# Patient Record
Sex: Female | Born: 1970 | Race: Black or African American | Hispanic: No | Marital: Married | Smoking: Current every day smoker
Health system: Southern US, Community
[De-identification: ages and names within clinical notes are randomized; demographics above are authoritative.]

## PROBLEM LIST (undated history)

## (undated) DIAGNOSIS — I639 Cerebral infarction, unspecified: Secondary | ICD-10-CM

## (undated) DIAGNOSIS — I10 Essential (primary) hypertension: Secondary | ICD-10-CM

## (undated) DIAGNOSIS — E119 Type 2 diabetes mellitus without complications: Secondary | ICD-10-CM

## (undated) DIAGNOSIS — O039 Complete or unspecified spontaneous abortion without complication: Secondary | ICD-10-CM

## (undated) HISTORY — PX: CYST EXCISION: SHX5701

## (undated) HISTORY — PX: DILATATION & CURETTAGE/HYSTEROSCOPY WITH TRUECLEAR: SHX6353

## (undated) HISTORY — PX: TUBAL LIGATION: SHX77

---

## 2013-08-10 ENCOUNTER — Emergency Department (HOSPITAL_BASED_OUTPATIENT_CLINIC_OR_DEPARTMENT_OTHER)
Admission: EM | Admit: 2013-08-10 | Discharge: 2013-08-10 | Disposition: A | Discharge status: Home / Self Care | Payer: Medicaid Other | Attending: Emergency Medicine | Admitting: Emergency Medicine

## 2013-08-10 ENCOUNTER — Encounter (HOSPITAL_BASED_OUTPATIENT_CLINIC_OR_DEPARTMENT_OTHER): Payer: Self-pay | Admitting: Emergency Medicine

## 2013-08-10 ENCOUNTER — Emergency Department (HOSPITAL_BASED_OUTPATIENT_CLINIC_OR_DEPARTMENT_OTHER): Payer: Medicaid Other

## 2013-08-10 DIAGNOSIS — R0789 Other chest pain: Secondary | ICD-10-CM

## 2013-08-10 DIAGNOSIS — R109 Unspecified abdominal pain: Secondary | ICD-10-CM | POA: Insufficient documentation

## 2013-08-10 DIAGNOSIS — Z791 Long term (current) use of non-steroidal anti-inflammatories (NSAID): Secondary | ICD-10-CM | POA: Insufficient documentation

## 2013-08-10 DIAGNOSIS — R071 Chest pain on breathing: Secondary | ICD-10-CM | POA: Insufficient documentation

## 2013-08-10 DIAGNOSIS — Z880 Allergy status to penicillin: Secondary | ICD-10-CM | POA: Insufficient documentation

## 2013-08-10 DIAGNOSIS — R9431 Abnormal electrocardiogram [ECG] [EKG]: Secondary | ICD-10-CM | POA: Insufficient documentation

## 2013-08-10 DIAGNOSIS — F17200 Nicotine dependence, unspecified, uncomplicated: Secondary | ICD-10-CM | POA: Insufficient documentation

## 2013-08-10 DIAGNOSIS — M25519 Pain in unspecified shoulder: Secondary | ICD-10-CM | POA: Insufficient documentation

## 2013-08-10 HISTORY — DX: Complete or unspecified spontaneous abortion without complication: O03.9

## 2013-08-10 LAB — COMPREHENSIVE METABOLIC PANEL WITH GFR
ALT: 10 U/L (ref 0–35)
AST: 11 U/L (ref 0–37)
Albumin: 3.8 g/dL (ref 3.5–5.2)
Alkaline Phosphatase: 69 U/L (ref 39–117)
BUN: 14 mg/dL (ref 6–23)
CO2: 24 meq/L (ref 19–32)
Calcium: 9.4 mg/dL (ref 8.4–10.5)
Chloride: 104 meq/L (ref 96–112)
Creatinine, Ser: 0.8 mg/dL (ref 0.50–1.10)
GFR calc Af Amer: >90 mL/min
GFR calc non Af Amer: 90 mL/min — ABNORMAL LOW
Glucose, Bld: 134 mg/dL — ABNORMAL HIGH (ref 70–99)
Potassium: 3.6 meq/L (ref 3.5–5.1)
Sodium: 140 meq/L (ref 135–145)
Total Bilirubin: 0.2 mg/dL — ABNORMAL LOW (ref 0.3–1.2)
Total Protein: 7.9 g/dL (ref 6.0–8.3)

## 2013-08-10 LAB — CBC WITH DIFFERENTIAL/PLATELET
Basophils Absolute: 0 10*3/uL (ref 0.0–0.1)
Eosinophils Absolute: 0.3 10*3/uL (ref 0.0–0.7)
HCT: 41.3 % (ref 36.0–46.0)
Hemoglobin: 13.6 g/dL (ref 12.0–15.0)
Lymphs Abs: 2.8 10*3/uL (ref 0.7–4.0)
MCH: 27.8 pg (ref 26.0–34.0)
Monocytes Relative: 6 % (ref 3–12)
Neutro Abs: 7.9 10*3/uL — ABNORMAL HIGH (ref 1.7–7.7)
Neutrophils Relative %: 68 % (ref 43–77)
Platelets: 316 10*3/uL (ref 150–400)
RBC: 4.89 MIL/uL (ref 3.87–5.11)
WBC: 11.6 10*3/uL — ABNORMAL HIGH (ref 4.0–10.5)

## 2013-08-10 LAB — D-DIMER, QUANTITATIVE: D-Dimer, Quant: <0.27 ug{FEU}/mL (ref 0.00–0.48)

## 2013-08-10 LAB — TROPONIN I
Troponin I: <0.3 ng/mL
Troponin I: <0.3 ng/mL

## 2013-08-10 LAB — LIPASE, BLOOD: Lipase: 30 U/L (ref 11–59)

## 2013-08-10 MED ORDER — HYDROCODONE-ACETAMINOPHEN 5-325 MG PO TABS: 2.0000 | ORAL_TABLET | ORAL | Status: DC | PRN

## 2013-08-10 MED ORDER — IOHEXOL 350 MG/ML SOLN
100.0000 mL | Freq: Once | INTRAVENOUS | Status: AC | PRN
  Administered 2013-08-10: 100 mL via INTRAVENOUS

## 2013-08-10 MED ORDER — HYDROCODONE-ACETAMINOPHEN 5-325 MG PO TABS
2.0000 | ORAL_TABLET | Freq: Once | ORAL | Status: AC
  Administered 2013-08-10: 2 via ORAL
  Filled 2013-08-10: qty 2

## 2013-08-10 MED ORDER — IBUPROFEN 800 MG PO TABS: 800.0000 mg | ORAL_TABLET | Freq: Three times a day (TID) | ORAL | Status: DC

## 2013-08-10 NOTE — ED Provider Notes (Signed)
CSN: [id number]     Arrival date & time 08/10/13  0908 History   First MD Initiated Contact with Patient 08/10/13 [phone]     Chief Complaint  Patient presents with  . Chest Pain    Began yesterday before lifting grandson from the floor. Worse with movement and when "laying on it". Was SOB yest but this was resolved after 2 min  . Arm Pain    left arm   (Consider location/radiation/quality/duration/timing/severity/associated sxs/prior Treatment) HPI Comments: Patient awoke yesterday morning with left sided chest pain and shoulder pain. This got worse when she attempted to pick up her 63 month old grandson. She's had constant pain since 9 AM yesterday. Is worse with movement worse with palpation. She endorses mild shortness of breath. She denies any nausea, vomiting or diaphoresis. She denies any cardiac history. She denies any history of hypertension, hypercholesterolemia. She is a smoker. She has not had pain like this in the past. There is no weakness, numbness or tingling in the arm. No headache. Denies any falls or injuries. She taking ibuprofen without relief. She had a tubal ligation last week in [facility].  The history is provided by the patient.    Past Medical History  Diagnosis Date  . Miscarriage     X 3  . Other multiple births, all stillborn    Past Surgical History  Procedure Laterality Date  . Tubal ligation    . Dilatation & curettage/hysteroscopy with trueclear      X 5  . Cyst excision    . Cesarean section  05/07/2013   History reviewed. No pertinent family history. History  Substance Use Topics  . Smoking status: Current Every Day Smoker -- 0.50 packs/day    Types: Cigarettes  . Smokeless tobacco: Not on file  . Alcohol Use: No   OB History   Grav Para Term Preterm Abortions TAB SAB Ect Mult Living                 Review of Systems  Constitutional: Negative for activity change and appetite change.  Respiratory: Positive for chest tightness. Negative  for cough.   Cardiovascular: Positive for chest pain.  Gastrointestinal: Positive for abdominal pain. Negative for nausea and vomiting.  Genitourinary: Negative for dysuria and hematuria.  Musculoskeletal: Positive for arthralgias and myalgias.  Skin: Negative for rash.  Neurological: Negative for dizziness, weakness and headaches.  A complete 10 system review of systems was obtained and all systems are negative except as noted in the HPI and PMH.    Allergies  Flagyl and Penicillins  Home Medications   Current Outpatient Rx  Name  Route  Sig  Dispense  Refill  . HYDROcodone-acetaminophen (NORCO/VICODIN) 5-325 MG per tablet   Oral   Take 2 tablets by mouth every 4 (four) hours as needed for pain.   10 tablet   0   . ibuprofen (ADVIL,MOTRIN) 800 MG tablet   Oral   Take 1 tablet (800 mg total) by mouth 3 (three) times daily.   21 tablet   0    BP 145/84  Pulse 77  Temp(Src) 98.3 F (36.8 C) (Oral)  Resp 20  Ht 5\' 2"  (1.575 m)  Wt 225 lb (102.059 kg)  BMI 41.14 kg/m2  SpO2 99%  LMP 07/26/2013 Physical Exam  Constitutional: She is oriented to person, place, and time. She appears well-developed and well-nourished. No distress.  HENT:  Head: Normocephalic and atraumatic.  Mouth/Throat: Oropharynx is clear and moist. No oropharyngeal  exudate.  Eyes: Conjunctivae and EOM are normal. Pupils are equal, round, and reactive to light.  Neck: Normal range of motion. Neck supple.  Cardiovascular: Normal rate, regular rhythm and normal heart sounds.   No murmur heard. Pulmonary/Chest: Effort normal and breath sounds normal. No respiratory distress. She exhibits tenderness.  Abdominal: Soft.  Musculoskeletal: Normal range of motion. She exhibits tenderness. She exhibits no edema.  L chest wall and shoulder tenderness with ROM of shoulder.  +2 radial pulse, cardinal hand movements intact  Neurological: She is alert and oriented to person, place, and time. No cranial nerve deficit.  She exhibits normal muscle tone. Coordination normal.  CN 2-12 intact, no ataxia on finger to nose, no nystagmus, 5/5 strength throughout, no pronator drift, Romberg negative, normal gait.   Skin: Skin is warm.    ED Course  Procedures (including critical care time) Labs Review Labs Reviewed  CBC WITH DIFFERENTIAL - Abnormal; Notable for the following:    WBC 11.6 (*)    Neutro Abs 7.9 (*)    All other components within normal limits  COMPREHENSIVE METABOLIC PANEL - Abnormal; Notable for the following:    Glucose, Bld 134 (*)    Total Bilirubin 0.2 (*)    GFR calc non Af Amer 90 (*)    All other components within normal limits  LIPASE, BLOOD  TROPONIN I  D-DIMER, QUANTITATIVE  TROPONIN I   Imaging Review Dg Chest 2 View  08/10/2013   CLINICAL DATA:  Chest pain  EXAM: CHEST  2 VIEW  COMPARISON:  None.  FINDINGS: The heart and pulmonary vascularity are within normal limits. The lungs are well aerated without infiltrate. No acute bony abnormality is seen.  IMPRESSION: No active cardiopulmonary disease.   Electronically Signed   By: Alcide Clever M.D.   On: 08/10/2013 12:46   Ct Angio Chest Pe W/cm &/or Wo Cm  08/10/2013   CLINICAL DATA:  Chest pain.  EXAM: CT ANGIOGRAPHY CHEST WITH CONTRAST  TECHNIQUE: Multidetector CT imaging of the chest was performed using the standard protocol during bolus administration of intravenous contrast. Multiplanar CT image reconstructions including MIPs were obtained to evaluate the vascular anatomy.  CONTRAST:  OMNIPAQUE IOHEXOL 350 MG/ML SOLN  COMPARISON:  None.  FINDINGS: The chest wall is unremarkable. No breast masses, supraclavicular or axillary adenopathy. The thyroid gland appears normal. Bony thorax is intact.  The heart is normal in size. No pericardial effusion. No mediastinal or hilar mass or adenopathy. There are somewhat prominent hilar and subcarinal lymph nodes. No discrete mass. The esophagus is grossly normal. The aorta is normal in  caliber. No dissection.  The pulmonary arterial tree is well opacified. No filling defects to suggest pulmonary embolism.  Examination of the lung parenchyma demonstrates no acute pulmonary findings. Minimal streaky dependent subsegmental atelectasis. No infiltrates, edema or effusions. No worrisome pulmonary lesions. No findings for interstitial lung disease or bronchiectasis. Patchy areas of early emphysematous change are noted.  The upper abdomen is unremarkable.  Review of the MIP images confirms the above findings.  IMPRESSION: No CT findings for pulmonary embolism.  Normal thoracic aorta.  Slightly prominent hilar and subcarinal lymph nodes. No interstitial lung disease to suggest sarcoidosis. A followup chest CT suggested in 4 months to reassess.  No acute pulmonary findings.  Suspect early emphysematous changes.   Electronically Signed   By: Loralie Champagne M.D.   On: 08/10/2013 12:07   Dg Shoulder Left  08/10/2013   CLINICAL DATA:  Left  shoulder pain  EXAM: LEFT SHOULDER - 2+ VIEW  COMPARISON:  None.  FINDINGS: Mild degenerative changes of the acromioclavicular joint are seen. No acute bony abnormality is noted. No soft tissue changes are seen.  IMPRESSION: Degenerative change without acute abnormality.   Electronically Signed   By: Alcide Clever M.D.   On: 08/10/2013 12:45    EKG Interpretation     Ventricular Rate:  85 PR Interval:  146 QRS Duration: 84 QT Interval:  368 QTC Calculation: 437 R Axis:   -35 Text Interpretation:  Normal sinus rhythm Left axis deviation Left ventricular hypertrophy Nonspecific T wave abnormality Abnormal ECG            MDM   1. EKG abnormality   2. Chest wall pain    24 hour history of left-sided chest pain that radiates to left shoulder is worse with palpation movement.  EKG today shows T wave inversions in inferior lead. EKG obtained from [facility] dated 02/22/2013 shows T wave inversions in V1, V2 with LVH.   Troponin  negative. No evidence of PE. Patient's pain appears musculoskeletal as it is worse with movement worse with palpation. It is somewhat improved with the pain medication she received in the ED.  Her EKG abnormalities were discussed with on-call cardiologist Dr. Delton See. She agrees the patient needs an outpatient stress test. With ongoing pain for more than 24 hours a negative troponin x2, she feels appropriate for outpatient stress test. Will be referred to cardiology.  Patient will be treated for musculoskeletal chest pain. Return precautions discussed.     Glynn Octave, MD 08/10/13 [phone]

## 2013-08-10 NOTE — ED Notes (Signed)
MD at bedside. 

## 2013-08-10 NOTE — ED Notes (Addendum)
CP and L shoulder/arm pain began yesterday before lifting grandson from the floor. Worse with movement and when "laying on it". Was SOB yest but this was resolved after 2 min

## 2013-08-10 NOTE — ED Notes (Signed)
unsuccessful IV start x 2

## 2014-03-17 ENCOUNTER — Emergency Department (HOSPITAL_BASED_OUTPATIENT_CLINIC_OR_DEPARTMENT_OTHER)
Admission: EM | Admit: 2014-03-17 | Discharge: 2014-03-17 | Disposition: A | Discharge status: Home / Self Care | Payer: Medicaid Other | Attending: Emergency Medicine | Admitting: Emergency Medicine

## 2014-03-17 ENCOUNTER — Encounter (HOSPITAL_BASED_OUTPATIENT_CLINIC_OR_DEPARTMENT_OTHER): Payer: Self-pay | Admitting: Emergency Medicine

## 2014-03-17 ENCOUNTER — Emergency Department (HOSPITAL_BASED_OUTPATIENT_CLINIC_OR_DEPARTMENT_OTHER): Payer: Medicaid Other

## 2014-03-17 DIAGNOSIS — R51 Headache: Secondary | ICD-10-CM

## 2014-03-17 DIAGNOSIS — Z791 Long term (current) use of non-steroidal anti-inflammatories (NSAID): Secondary | ICD-10-CM | POA: Insufficient documentation

## 2014-03-17 DIAGNOSIS — Z880 Allergy status to penicillin: Secondary | ICD-10-CM | POA: Insufficient documentation

## 2014-03-17 DIAGNOSIS — R519 Headache, unspecified: Secondary | ICD-10-CM

## 2014-03-17 DIAGNOSIS — F17200 Nicotine dependence, unspecified, uncomplicated: Secondary | ICD-10-CM | POA: Insufficient documentation

## 2014-03-17 DIAGNOSIS — J329 Chronic sinusitis, unspecified: Secondary | ICD-10-CM | POA: Insufficient documentation

## 2014-03-17 MED ORDER — METOCLOPRAMIDE HCL 5 MG/ML IJ SOLN
10.0000 mg | Freq: Once | INTRAMUSCULAR | Status: AC
  Administered 2014-03-17: 10 mg via INTRAVENOUS
  Filled 2014-03-17: qty 2

## 2014-03-17 MED ORDER — SODIUM CHLORIDE 0.9 % IV SOLN
Freq: Once | INTRAVENOUS | Status: AC
  Administered 2014-03-17: 17:00:00 via INTRAVENOUS

## 2014-03-17 MED ORDER — HYDROCODONE-ACETAMINOPHEN 5-325 MG PO TABS: 2.0000 | ORAL_TABLET | ORAL | Status: DC | PRN

## 2014-03-17 MED ORDER — LEVOFLOXACIN 500 MG PO TABS: 500.0000 mg | ORAL_TABLET | Freq: Every day | ORAL | Status: DC

## 2014-03-17 MED ORDER — DIPHENHYDRAMINE HCL 50 MG/ML IJ SOLN
25.0000 mg | Freq: Once | INTRAMUSCULAR | Status: AC
  Administered 2014-03-17: 25 mg via INTRAVENOUS
  Filled 2014-03-17: qty 1

## 2014-03-17 MED ORDER — KETOROLAC TROMETHAMINE 30 MG/ML IJ SOLN
30.0000 mg | Freq: Once | INTRAMUSCULAR | Status: AC
  Administered 2014-03-17: 30 mg via INTRAVENOUS
  Filled 2014-03-17: qty 1

## 2014-03-17 NOTE — Discharge Instructions (Signed)
Sinus Headache °A sinus headache is when your sinuses become clogged or swollen. Sinus headaches can range from mild to severe.  °CAUSES °A sinus headache can have different causes, such as: °· Colds. °· Sinus infections. °· Allergies. °SYMPTOMS  °Symptoms of a sinus headache may vary and can include: °· Headache. °· Pain or pressure in the face. °· Congested or runny nose. °· Fever. °· Inability to smell. °· Pain in upper teeth. °Weather changes can make symptoms worse. °TREATMENT  °The treatment of a sinus headache depends on the cause. °· Sinus pain caused by a sinus infection may be treated with antibiotic medicine. °· Sinus pain caused by allergies may be helped by allergy medicines (antihistamines) and medicated nasal sprays. °· Sinus pain caused by congestion may be helped by flushing the nose and sinuses with saline solution. °HOME CARE INSTRUCTIONS  °· If antibiotics are prescribed, take them as directed. Finish them even if you start to feel better. °· Only take over-the-counter or prescription medicines for pain, discomfort, or fever as directed by your caregiver. °· If you have congestion, use a nasal spray to help reduce pressure. °SEEK IMMEDIATE MEDICAL CARE IF: °· You have a fever. °· You have headaches more than once a week. °· You have sensitivity to light or sound. °· You have repeated nausea and vomiting. °· You have vision problems. °· You have sudden, severe pain in your face or head. °· You have a seizure. °· You are confused. °· Your sinus headaches do not get better after treatment. Many people think they have a sinus headache when they actually have migraines or tension headaches. °MAKE SURE YOU:  °· Understand these instructions. °· Will watch your condition. °· Will get help right away if you are not doing well or get worse. °Document Released: 11/05/2004 Document Revised: 12/21/2011 Document Reviewed: 12/27/2010 °[employer]® Patient Information ©2014 [employer]. ° °

## 2014-03-17 NOTE — ED Notes (Signed)
Reports headache x six days.  Unlike previous migraines which resolve, she has had no relief of pain.  Has used OTC meds, been treated for sinus infection recently.   C/o vomiting yesterday, unsure of fever.  States pain is behind her eyes and all over.

## 2014-03-17 NOTE — ED Notes (Addendum)
D/c home after calling for ride- rx x 2 given for norco and levaquin

## 2014-03-17 NOTE — ED Provider Notes (Signed)
CSN: [id number]     Arrival date & time 03/17/14  1520 History   First MD Initiated Contact with Patient 03/17/14 1553     Chief Complaint  Patient presents with  . Headache     (Consider location/radiation/quality/duration/timing/severity/associated sxs/prior Treatment) Patient is a 43 y.o. female presenting with headaches. The history is provided by the patient. No language interpreter was used.  Headache Pain location:  Frontal Quality:  Sharp Severity currently:  10/10 Severity at highest:  10/10 Duration:  6 days Timing:  Constant Progression:  Worsening Chronicity:  New Similar to prior headaches: no   Context: activity   Relieved by:  Nothing Worsened by:  Nothing tried Ineffective treatments:  Acetaminophen and NSAIDs Associated symptoms: sore throat and swollen glands   Associated symptoms: no abdominal pain   pt complains of severe facial pain  Past Medical History  Diagnosis Date  . Miscarriage     X 3  . Other multiple births, all stillborn    Past Surgical History  Procedure Laterality Date  . Tubal ligation    . Dilatation & curettage/hysteroscopy with trueclear      X 5  . Cyst excision    . Cesarean section  05/07/2013   No family history on file. History  Substance Use Topics  . Smoking status: Current Every Day Smoker -- 0.50 packs/day    Types: Cigarettes  . Smokeless tobacco: Not on file  . Alcohol Use: No   OB History   Grav Para Term Preterm Abortions TAB SAB Ect Mult Living                 Review of Systems  HENT: Positive for sore throat.   Gastrointestinal: Negative for abdominal pain.  Neurological: Positive for headaches.  All other systems reviewed and are negative.     Allergies  Flagyl and Penicillins  Home Medications   Prior to Admission medications   Medication Sig Start Date End Date Taking? Authorizing Provider  HYDROcodone-acetaminophen (NORCO/VICODIN) 5-325 MG per tablet Take 2 tablets by mouth every 4 (four)  hours as needed for pain. 08/10/13   Glynn OctaveStephen Rancour, MD  ibuprofen (ADVIL,MOTRIN) 800 MG tablet Take 1 tablet (800 mg total) by mouth 3 (three) times daily. 08/10/13   Glynn OctaveStephen Rancour, MD   BP 165/113  Pulse 97  Temp(Src) 98.6 F (37 C) (Oral)  Resp 18  Ht 5\' 5"  (1.651 m)  Wt 248 lb (112.492 kg)  BMI 41.27 kg/m2  SpO2 95%  LMP 03/16/2014 Physical Exam  Nursing note and vitals reviewed. Constitutional: She is oriented to person, place, and time. She appears well-developed and well-nourished.  HENT:  Head: Normocephalic and atraumatic.  Right Ear: External ear normal.  Left Ear: External ear normal.  Mouth/Throat: Oropharynx is clear and moist.  Tender bilat maxillary sinuses and frontal sinuses  Eyes: Conjunctivae and EOM are normal. Pupils are equal, round, and reactive to light.  Neck: Normal range of motion.  Cardiovascular: Normal rate.   Pulmonary/Chest: Effort normal and breath sounds normal.  Abdominal: Soft. She exhibits no distension.  Musculoskeletal: Normal range of motion.  Neurological: She is alert and oriented to person, place, and time.  Skin: Skin is warm.  Psychiatric: She has a normal mood and affect.    ED Course  Procedures (including critical care time) Labs Review Labs Reviewed - No data to display  Imaging Review Ct Head Wo Contrast  03/17/2014   CLINICAL DATA:  Six days of headache; recent sinus infection;  retro-orbital pain  EXAM: CT HEAD WITHOUT CONTRAST  CT MAXILLOFACIAL WITHOUT CONTRAST  TECHNIQUE: Multidetector CT imaging of the head and maxillofacial structures were performed using the standard protocol without intravenous contrast. Multiplanar CT image reconstructions of the maxillofacial structures were also generated.  COMPARISON:  None.  FINDINGS: CT HEAD FINDINGS  The ventricles are normal in size and position. There is no intracranial hemorrhage nor intracranial mass effect. There is no evidence of acute ischemic change. The cerebellum and  brainstem are normal in density.  At bone window settings there is fluid in the right maxillary sinus and mucoperiosteal thickening and fluid within numerous ethmoid sinus cells. The mastoid air cells are clear. The calvarium is normal.  CT MAXILLOFACIAL FINDINGS  There is an air-fluid level in the right maxillary sinus. There is mucoperiosteal thickening of the left maxillary sinus and numerous ethmoid sinus cells. The sphenoid sinus on the right exhibits mucoperiosteal thickening. The right frontal sinus is well pneumatized. The left frontal sinus is hypoplastic.  The bony orbits are intact. The nasal bones and nasal septum are normal. The pterygoid plates are intact. The temporomandibular joints are normal. No mandibular fracture or other lesion is demonstrated.  The globes are normal in density and contour. There is no significant pre or postseptal edema. No intraconal or extraconal mass is demonstrated.  IMPRESSION: 1. There is no acute intracranial abnormality. 2. There are inflammatory changes of the maxillary and ethmoid sinuses bilaterally as well as the right sphenoid sinus. 3. No acute orbital abnormalities are demonstrated. 4. There is no facial bone fracture.   Electronically Signed   By: David  [place]   On: 03/17/2014 17:08   Ct Maxillofacial Wo Cm  03/17/2014   CLINICAL DATA:  Six days of headache; recent sinus infection; retro-orbital pain  EXAM: CT HEAD WITHOUT CONTRAST  CT MAXILLOFACIAL WITHOUT CONTRAST  TECHNIQUE: Multidetector CT imaging of the head and maxillofacial structures were performed using the standard protocol without intravenous contrast. Multiplanar CT image reconstructions of the maxillofacial structures were also generated.  COMPARISON:  None.  FINDINGS: CT HEAD FINDINGS  The ventricles are normal in size and position. There is no intracranial hemorrhage nor intracranial mass effect. There is no evidence of acute ischemic change. The cerebellum and brainstem are normal in density.   At bone window settings there is fluid in the right maxillary sinus and mucoperiosteal thickening and fluid within numerous ethmoid sinus cells. The mastoid air cells are clear. The calvarium is normal.  CT MAXILLOFACIAL FINDINGS  There is an air-fluid level in the right maxillary sinus. There is mucoperiosteal thickening of the left maxillary sinus and numerous ethmoid sinus cells. The sphenoid sinus on the right exhibits mucoperiosteal thickening. The right frontal sinus is well pneumatized. The left frontal sinus is hypoplastic.  The bony orbits are intact. The nasal bones and nasal septum are normal. The pterygoid plates are intact. The temporomandibular joints are normal. No mandibular fracture or other lesion is demonstrated.  The globes are normal in density and contour. There is no significant pre or postseptal edema. No intraconal or extraconal mass is demonstrated.  IMPRESSION: 1. There is no acute intracranial abnormality. 2. There are inflammatory changes of the maxillary and ethmoid sinuses bilaterally as well as the right sphenoid sinus. 3. No acute orbital abnormalities are demonstrated. 4. There is no facial bone fracture.   Electronically Signed   By: David  [place]   On: 03/17/2014 17:08     EKG Interpretation None  MDM Pt given Iv fluids and migraine cocktail.   Pt reports feeling better.   Ct shows severe sinus disease.      Final diagnoses:  Sinusitis  Headache   Pt reports feeling better after medication     Elson Areas, PA-C 03/17/14 2216

## 2014-03-19 NOTE — ED Provider Notes (Signed)
Medical screening examination/treatment/procedure(s) were performed by non-physician practitioner and as supervising physician I was immediately available for consultation/collaboration.  Shanna Cisco, MD 03/19/14 1425

## 2016-01-23 ENCOUNTER — Emergency Department (HOSPITAL_BASED_OUTPATIENT_CLINIC_OR_DEPARTMENT_OTHER)
Admission: EM | Admit: 2016-01-23 | Discharge: 2016-01-23 | Discharge status: Against Medical Advice | Payer: Medicaid Other

## 2016-01-23 NOTE — ED Notes (Signed)
Pt is at wrong location per workers comp paperwork. States she will go to the required place.  No distress. Warm and dry.

## 2018-09-21 ENCOUNTER — Emergency Department (HOSPITAL_BASED_OUTPATIENT_CLINIC_OR_DEPARTMENT_OTHER)
Admission: EM | Admit: 2018-09-21 | Discharge: 2018-09-22 | Disposition: A | Discharge status: Home / Self Care | Payer: Medicaid Other | Attending: Emergency Medicine | Admitting: Emergency Medicine

## 2018-09-21 ENCOUNTER — Emergency Department (HOSPITAL_BASED_OUTPATIENT_CLINIC_OR_DEPARTMENT_OTHER): Payer: Medicaid Other

## 2018-09-21 ENCOUNTER — Encounter (HOSPITAL_BASED_OUTPATIENT_CLINIC_OR_DEPARTMENT_OTHER): Payer: Self-pay | Admitting: *Deleted

## 2018-09-21 ENCOUNTER — Other Ambulatory Visit: Payer: Self-pay

## 2018-09-21 DIAGNOSIS — F17210 Nicotine dependence, cigarettes, uncomplicated: Secondary | ICD-10-CM | POA: Insufficient documentation

## 2018-09-21 DIAGNOSIS — M25511 Pain in right shoulder: Secondary | ICD-10-CM | POA: Insufficient documentation

## 2018-09-21 MED ORDER — IBUPROFEN 400 MG PO TABS
400.0000 mg | ORAL_TABLET | Freq: Once | ORAL | Status: AC
  Administered 2018-09-22: 400 mg via ORAL
  Filled 2018-09-21: qty 1

## 2018-09-21 MED ORDER — CYCLOBENZAPRINE HCL 10 MG PO TABS: 10.0000 mg | ORAL_TABLET | Freq: Two times a day (BID) | ORAL | 0 refills | Status: DC | PRN

## 2018-09-21 NOTE — ED Triage Notes (Signed)
Left shoulder pain x 6 days. Worsening with movement.

## 2018-09-22 NOTE — ED Notes (Signed)
Pt verbalizes understanding of d/c instructions and denies any further needs at this time. 

## 2018-09-22 NOTE — ED Provider Notes (Signed)
[facility] EMERGENCY DEPARTMENT Provider Note   CSN: [id number] Arrival date & time: 09/21/18  2245     History   Chief Complaint Chief Complaint  Patient presents with  . Shoulder Pain    HPI Julie Myers is a 47 y.o. female.  The history is provided by the patient.  Shoulder Pain   This is a new problem. The current episode started more than 2 days ago. The problem occurs daily. The problem has been gradually worsening. The pain is present in the right shoulder. The pain is moderate. Associated symptoms include limited range of motion. The symptoms are aggravated by activity. She has tried OTC ointments for the symptoms. The treatment provided no relief. There has been no history of extremity trauma.  She reports onset of right-sided neck and right shoulder pain about 6 days ago.  Seems to be worse with poor sleeping habits.  She is also had heavy lifting at work.  No falls or trauma.  No chest pain or shortness of breath.  No weakness in her arm.  No previous neck surgery  Past Medical History:  Diagnosis Date  . Miscarriage    X 3  . Other multiple births, all stillborn     There are no active problems to display for this patient.   Past Surgical History:  Procedure Laterality Date  . CESAREAN SECTION  05/07/2013  . CYST EXCISION    . DILATATION & CURETTAGE/HYSTEROSCOPY WITH TRUECLEAR     X 5  . TUBAL LIGATION       OB History   None      Home Medications    Prior to Admission medications   Medication Sig Start Date End Date Taking? Authorizing Provider  cyclobenzaprine (FLEXERIL) 10 MG tablet Take 1 tablet (10 mg total) by mouth 2 (two) times daily as needed for muscle spasms. 09/21/18   Zadie Rhine, MD    Family History History reviewed. No pertinent family history.  Social History Social History   Tobacco Use  . Smoking status: Current Every Day Smoker    Packs/day: 0.50    Types: Cigarettes  Substance Use Topics  . Alcohol  use: No  . Drug use: No     Allergies   Flagyl [metronidazole] and Penicillins   Review of Systems Review of Systems  Respiratory: Negative for shortness of breath.   Cardiovascular: Negative for chest pain.  Musculoskeletal: Positive for arthralgias, myalgias and neck pain.  Neurological: Negative for weakness.  All other systems reviewed and are negative.    Physical Exam Updated Vital Signs BP (!) 221/117 Comment: non-compliant with BP meds  Pulse 88   Temp 98.2 F (36.8 C) (Oral)   Resp 18   Ht 1.575 m (5\' 2" )   Wt 98.9 kg   LMP 09/12/2018   SpO2 99%   BMI 39.87 kg/m   Physical Exam  CONSTITUTIONAL: Well developed/well nourished HEAD: Normocephalic/atraumatic EYES: EOMI/PERRL ENMT: Mucous membranes moist NECK: supple no meningeal signs SPINE/BACK:entire spine nontender, cervical paraspinal tenderness, no warmth, no edema, no abscess CV: S1/S2 noted, no murmurs/rubs/gallops noted LUNGS: Lungs are clear to auscultation bilaterally, no apparent distress ABDOMEN: soft, nontender, no rebound or guarding, bowel sounds noted throughout abdomen GU:no cva tenderness NEURO: Pt is awake/alert/appropriate, moves all extremitiesx4.  No facial droop.   EXTREMITIES: pulses normal/equal, full ROM mild tenderness to right anterior shoulder.  No erythema, no edema.  She can fully range the right shoulder.  Distal pulses equal and intact Equal  power (5/5) with hand grip, wrist flex/extension, elbow flex/extension, and equal power with shoulder abduction/adduction.    Equal (2+) biceps/brachioradialis/tricep reflex in bilateral UE SKIN: warm, color normal PSYCH: no abnormalities of mood noted, alert and oriented to situation  ED Treatments / Results  Labs (all labs ordered are listed, but only abnormal results are displayed) Labs Reviewed - No data to display  EKG None  Radiology Dg Shoulder Right  Result Date: 09/21/2018 CLINICAL DATA:  Shoulder pain EXAM: RIGHT  SHOULDER - 2+ VIEW COMPARISON:  None. FINDINGS: No fracture or malalignment. AC joint is intact. Joint spaces are relatively maintained IMPRESSION: No acute osseous abnormality Electronically Signed   By: Jasmine PangKim  Fujinaga M.D.   On: 09/21/2018 23:55    Procedures Procedures    Medications Ordered in ED Medications  ibuprofen (ADVIL,MOTRIN) tablet 400 mg (has no administration in time range)     Initial Impression / Assessment and Plan / ED Course  I have reviewed the triage vital signs and the nursing notes.  Pertinent  imaging results that were available during my care of the patient were reviewed by me and considered in my medical decision making (see chart for details).     PT Well-appearing.  No focal weakness noted in her arm.  Will treat as musculoskeletal strain.  Also feel that she is having spasms.  Will start Flexeril, refer to sports medicine  Final Clinical Impressions(s) / ED Diagnoses   Final diagnoses:  Acute pain of right shoulder    ED Discharge Orders         Ordered    cyclobenzaprine (FLEXERIL) 10 MG tablet  2 times daily PRN     09/21/18 2354           Zadie RhineWickline, Herberto Ledwell, MD 09/22/18 0003

## 2020-03-23 ENCOUNTER — Emergency Department (HOSPITAL_BASED_OUTPATIENT_CLINIC_OR_DEPARTMENT_OTHER)
Admission: EM | Admit: 2020-03-23 | Discharge: 2020-03-23 | Disposition: A | Discharge status: Home / Self Care | Payer: Medicaid Other | Attending: Emergency Medicine | Admitting: Emergency Medicine

## 2020-03-23 ENCOUNTER — Encounter (HOSPITAL_BASED_OUTPATIENT_CLINIC_OR_DEPARTMENT_OTHER): Payer: Self-pay | Admitting: Emergency Medicine

## 2020-03-23 ENCOUNTER — Emergency Department (HOSPITAL_BASED_OUTPATIENT_CLINIC_OR_DEPARTMENT_OTHER): Payer: Medicaid Other

## 2020-03-23 ENCOUNTER — Other Ambulatory Visit: Payer: Self-pay

## 2020-03-23 DIAGNOSIS — E119 Type 2 diabetes mellitus without complications: Secondary | ICD-10-CM | POA: Insufficient documentation

## 2020-03-23 DIAGNOSIS — I10 Essential (primary) hypertension: Secondary | ICD-10-CM | POA: Diagnosis not present

## 2020-03-23 DIAGNOSIS — Z79899 Other long term (current) drug therapy: Secondary | ICD-10-CM | POA: Diagnosis not present

## 2020-03-23 DIAGNOSIS — Z8673 Personal history of transient ischemic attack (TIA), and cerebral infarction without residual deficits: Secondary | ICD-10-CM | POA: Diagnosis not present

## 2020-03-23 DIAGNOSIS — Z794 Long term (current) use of insulin: Secondary | ICD-10-CM | POA: Insufficient documentation

## 2020-03-23 DIAGNOSIS — F17210 Nicotine dependence, cigarettes, uncomplicated: Secondary | ICD-10-CM | POA: Diagnosis not present

## 2020-03-23 DIAGNOSIS — M25512 Pain in left shoulder: Secondary | ICD-10-CM

## 2020-03-23 DIAGNOSIS — M79622 Pain in left upper arm: Secondary | ICD-10-CM | POA: Diagnosis present

## 2020-03-23 HISTORY — DX: Cerebral infarction, unspecified: I63.9

## 2020-03-23 HISTORY — DX: Type 2 diabetes mellitus without complications: E11.9

## 2020-03-23 HISTORY — DX: Essential (primary) hypertension: I10

## 2020-03-23 MED ORDER — HYDROCODONE-ACETAMINOPHEN 5-325 MG PO TABS: 1.0000 | ORAL_TABLET | Freq: Four times a day (QID) | ORAL | 0 refills | Status: DC | PRN

## 2020-03-23 MED ORDER — KETOROLAC TROMETHAMINE 60 MG/2ML IM SOLN
60.0000 mg | Freq: Once | INTRAMUSCULAR | Status: AC
  Administered 2020-03-23: 60 mg via INTRAMUSCULAR
  Filled 2020-03-23: qty 2

## 2020-03-23 MED ORDER — NAPROXEN 375 MG PO TABS
375.0000 mg | ORAL_TABLET | Freq: Two times a day (BID) | ORAL | 0 refills | Status: DC
Start: 2020-03-23 — End: 2020-11-13

## 2020-03-23 NOTE — ED Triage Notes (Signed)
Pt c/o left arm pain x 1 week. Pt denies injury, denies CP, denies shob

## 2020-03-23 NOTE — ED Notes (Signed)
ED Provider at bedside. 

## 2020-03-23 NOTE — Discharge Instructions (Signed)
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  That is most likely what is causing the pain.  However because the ear sugar being uncontrolled we will not do steroids today.  Take the 2 medications prescribed for pain but continue doing a heating pad and follow-up with the orthopedic doctor for further care.

## 2020-03-23 NOTE — ED Provider Notes (Signed)
[facility] EMERGENCY DEPARTMENT Provider Note   CSN: [id number] Arrival date & time: 03/23/20  2002     History Chief Complaint  Patient presents with  . Arm Pain    Julie Myers is a 49 y.o. female.  The history is provided by the patient.  Arm Pain This is a new problem. Episode onset: 1 week. The problem occurs constantly. The problem has been gradually worsening. Associated symptoms comments: Left shoulder pain that radiates to the elbow and worse with movement.  No known trauma or prior hx of similar. The symptoms are aggravated by bending and twisting. Relieved by: slightly better with muscle rubs, tylenol and certain positions. She has tried a warm compress (otc meds) for the symptoms. The treatment provided no relief.       Past Medical History:  Diagnosis Date  . Diabetes mellitus without complication ([place])   . Hypertension   . Miscarriage    X 3  . Other multiple births, all stillborn   . Stroke [facility])     There are no problems to display for this patient.   Past Surgical History:  Procedure Laterality Date  . CESAREAN SECTION  05/07/2013  . CYST EXCISION    . DILATATION & CURETTAGE/HYSTEROSCOPY WITH TRUECLEAR     X 5  . TUBAL LIGATION       OB History   No obstetric history on file.     History reviewed. No pertinent family history.  Social History   Tobacco Use  . Smoking status: Current Every Day Smoker    Packs/day: 0.50    Types: Cigarettes  Substance Use Topics  . Alcohol use: No  . Drug use: No    Home Medications Prior to Admission medications   Medication Sig Start Date End Date Taking? Authorizing Provider  amLODipine (NORVASC) 10 MG tablet Take by mouth. 07/06/19  Yes [provider]  atorvastatin (LIPITOR) 40 MG tablet Take by mouth. 12/18/19  Yes [provider]  clonazePAM (KLONOPIN) 2 MG tablet Take 1/2 tab twice during the day and 1 at night 02/22/20  Yes [provider]  insulin  glargine (LANTUS SOLOSTAR) 100 UNIT/ML Solostar Pen INJECT 10 UNITS UNDER THE SKIN NIGHTLY. MAX DAILY DOSE IS 50 UNITS 02/19/20  Yes [provider]  liraglutide (VICTOZA) 18 MG/3ML SOPN Inject into the skin. 10/23/19  Yes [provider]  metoprolol succinate (TOPROL-XL) 25 MG 24 hr tablet Take by mouth. 12/18/19  Yes [provider]  mirtazapine (REMERON) 15 MG tablet Take 1 tab at night 11/28/19  Yes [provider]  valsartan (DIOVAN) 40 MG tablet Take by mouth. 12/06/19  Yes [provider]  zolpidem (AMBIEN) 10 MG tablet Take by mouth. 02/22/20  Yes [provider]  cyclobenzaprine (FLEXERIL) 10 MG tablet Take 1 tablet (10 mg total) by mouth 2 (two) times daily as needed for muscle spasms. 09/21/18   Ripley Fraise, MD    Allergies    Flagyl [metronidazole] and Penicillins  Review of Systems   Review of Systems  All other systems reviewed and are negative.   Physical Exam Updated Vital Signs BP (!) 157/91 (BP Location: Right Arm)   Pulse 86   Temp 98.6 F (37 C) (Oral)   Resp 18   Ht 5\' 2"  (1.575 m)   Wt 115.2 kg   LMP 02/25/2020 (Approximate)   SpO2 99%   BMI 46.46 kg/m   Physical Exam Vitals and nursing note reviewed.  Constitutional:  General: She is not in acute distress.    Appearance: Normal appearance. She is obese.  HENT:     Head: Normocephalic.     Mouth/Throat:     Mouth: Mucous membranes are moist.  Cardiovascular:     Rate and Rhythm: Normal rate.     Pulses: Normal pulses.  Pulmonary:     Effort: Pulmonary effort is normal.  Musculoskeletal:        General: Tenderness present.     Comments: Significant pain with palpation over the [facility] joint.  No swelling, erythema or warmth.  Pain with shoulder abduction and flexion  Skin:    General: Skin is warm and dry.     Capillary Refill: Capillary refill takes less than 2 seconds.  Neurological:     General: No focal deficit present.     Mental Status:  She is alert and oriented to person, place, and time. Mental status is at baseline.  Psychiatric:        Mood and Affect: Mood normal.        Behavior: Behavior normal.        Thought Content: Thought content normal.      ED Results / Procedures / Treatments   Labs (all labs ordered are listed, but only abnormal results are displayed) Labs Reviewed - No data to display  EKG None  Radiology DG Shoulder Left  Result Date: 03/23/2020 CLINICAL DATA:  Left shoulder pain for 1 week. Limited range of motion. No known injury. History of bursitis. EXAM: LEFT SHOULDER - 2+ VIEW COMPARISON:  Shoulder radiograph 02/08/2013 FINDINGS: There is no evidence of fracture or dislocation. Acromioclavicular spurring with slight progression from 2014. Small inferiorly directed osteophytes. No evidence of focal bone lesion or avascular necrosis. Soft tissues are unremarkable. IMPRESSION: Mild acromioclavicular degenerative change with slight progression from 2014. Electronically Signed   By: Narda Rutherford M.D.   On: 03/23/2020 21:00    Procedures Procedures (including critical care time)  Medications Ordered in ED Medications  ketorolac (TORADOL) injection 60 mg (60 mg Intramuscular Given 03/23/20 2259)    ED Course  I have reviewed the triage vital signs and the nursing notes.  Pertinent labs & imaging results that were available during my care of the patient were reviewed by me and considered in my medical decision making (see chart for details).    MDM Rules/Calculators/A&P                          Patient presenting today with persistently worsening pain in the left shoulder for the last 1 week.  She has tried multiple home therapies and states usually when she has pain it goes away within a day or 2 but this has been persistent.  She denies any trauma.  X-ray shows mild acromioclavicular degeneration with slight progression from 2014.  This is where she is most tender on exam.  Low suspicion  for septic joint at this time.  She has no neurologic complaints.  Will treat with pain control and ongoing anti-inflammatories.  We will not do steroids at this time as patient is a poorly controlled diabetic.  MDM Number of Diagnoses or Management Options   Amount and/or Complexity of Data Reviewed Tests in the radiology section of CPT: ordered and reviewed Decide to obtain previous medical records or to obtain history from someone other than the patient: yes Review and summarize past medical records: yes Independent visualization of images, tracings, or specimens:  yes  Risk of Complications, Morbidity, and/or Mortality Presenting problems: low Diagnostic procedures: low Management options: low  Patient Progress Patient progress: stable  Final Clinical Impression(s) / ED Diagnoses Final diagnoses:  Acute pain of left shoulder    Rx / DC Orders ED Discharge Orders         Ordered    naproxen (NAPROSYN) 375 MG tablet  2 times daily     Discontinue  Reprint     03/23/20 2327    HYDROcodone-acetaminophen (NORCO/VICODIN) 5-325 MG tablet  Every 6 hours PRN     Discontinue  Reprint     03/23/20 2327           Gwyneth Sprout, MD 03/23/20 2327

## 2020-11-13 ENCOUNTER — Encounter (HOSPITAL_BASED_OUTPATIENT_CLINIC_OR_DEPARTMENT_OTHER): Payer: Self-pay | Admitting: Emergency Medicine

## 2020-11-13 ENCOUNTER — Emergency Department (HOSPITAL_BASED_OUTPATIENT_CLINIC_OR_DEPARTMENT_OTHER)
Admission: EM | Admit: 2020-11-13 | Discharge: 2020-11-13 | Disposition: A | Discharge status: Home / Self Care | Payer: Medicaid Other | Attending: Emergency Medicine | Admitting: Emergency Medicine

## 2020-11-13 ENCOUNTER — Other Ambulatory Visit: Payer: Self-pay

## 2020-11-13 DIAGNOSIS — Z794 Long term (current) use of insulin: Secondary | ICD-10-CM | POA: Diagnosis not present

## 2020-11-13 DIAGNOSIS — Z8673 Personal history of transient ischemic attack (TIA), and cerebral infarction without residual deficits: Secondary | ICD-10-CM | POA: Insufficient documentation

## 2020-11-13 DIAGNOSIS — M79672 Pain in left foot: Secondary | ICD-10-CM

## 2020-11-13 DIAGNOSIS — I10 Essential (primary) hypertension: Secondary | ICD-10-CM | POA: Diagnosis not present

## 2020-11-13 DIAGNOSIS — F17210 Nicotine dependence, cigarettes, uncomplicated: Secondary | ICD-10-CM | POA: Insufficient documentation

## 2020-11-13 DIAGNOSIS — Z79899 Other long term (current) drug therapy: Secondary | ICD-10-CM | POA: Insufficient documentation

## 2020-11-13 DIAGNOSIS — E119 Type 2 diabetes mellitus without complications: Secondary | ICD-10-CM | POA: Diagnosis not present

## 2020-11-13 LAB — D-DIMER, QUANTITATIVE: D-Dimer, Quant: 0.38 ug/mL-FEU (ref 0.00–0.50)

## 2020-11-13 MED ORDER — NAPROXEN 250 MG PO TABS
500.0000 mg | ORAL_TABLET | Freq: Once | ORAL | Status: AC
  Administered 2020-11-13: 500 mg via ORAL
  Filled 2020-11-13: qty 2

## 2020-11-13 NOTE — ED Triage Notes (Addendum)
Pt reports sudden onset of left ankle pain that radiates up the leg when ambulating; pt reports hx of joint issues and diabetes and HTN and states "I'm worried about blood clots"; pt denies any recent injury or hx of clots; pt ambulatory but with pain; pt reports trying topical icy hot without relief, no other meds PTA

## 2020-11-13 NOTE — ED Notes (Signed)
Patient verbalizes understanding of discharge instructions. Opportunity for questioning and answers were provided. Armband removed by staff, pt discharged from ED in wheelchair to home.   

## 2020-11-13 NOTE — ED Notes (Signed)
EDP at Bedside 

## 2020-11-13 NOTE — ED Provider Notes (Signed)
[facility] Provider Note: [name], MD, FACEP  CSN: [id number] MRN: [id number] ARRIVAL: 11/13/20 at 0310 ROOM: [facility]05/[facility]05   CHIEF COMPLAINT  Ankle Pain   HISTORY OF PRESENT ILLNESS  11/13/20 3:26 AM Julie Myers is a 50 y.o. female who developed medial left heel pain yesterday evening about 6:30 PM.  The onset was fairly sudden.  There was no injury or other inciting event.  She feels the pain radiating up her leg to her left lateral thigh.  Her left medial heel is somewhat swollen and very tender to palpation.  She rates the pain as a 9 out of 10.  She is concerned about blood clots due to a history in the family.  She denies any chest pain or shortness of breath.  She has applied icy hot and other topical medications without relief.   Past Medical History:  Diagnosis Date  . Diabetes mellitus without complication (HCC)   . Hypertension   . Miscarriage    X 3  . Other multiple births, all stillborn   . Stroke [facility])     Past Surgical History:  Procedure Laterality Date  . CESAREAN SECTION  05/07/2013  . CYST EXCISION    . DILATATION & CURETTAGE/HYSTEROSCOPY WITH TRUECLEAR     X 5  . TUBAL LIGATION      No family history on file.  Social History   Tobacco Use  . Smoking status: Current Every Day Smoker    Packs/day: 0.50    Types: Cigarettes  . Smokeless tobacco: Never Used  Substance Use Topics  . Alcohol use: No  . Drug use: No    Prior to Admission medications   Medication Sig Start Date End Date Taking? Authorizing Provider  amLODipine (NORVASC) 10 MG tablet Take by mouth. 07/06/19   [provider]  atorvastatin (LIPITOR) 40 MG tablet Take by mouth. 12/18/19   [provider]  clonazePAM Scarlette Calico) 2 MG tablet Take 1/2 tab twice during the day and 1 at night 02/22/20   [provider]  insulin glargine (LANTUS SOLOSTAR) 100 UNIT/ML Solostar Pen INJECT 10 UNITS UNDER THE SKIN NIGHTLY. MAX DAILY DOSE IS 50 UNITS  02/19/20   [provider]  liraglutide (VICTOZA) 18 MG/3ML SOPN Inject into the skin. 10/23/19   [provider]  metoprolol succinate (TOPROL-XL) 25 MG 24 hr tablet Take by mouth. 12/18/19   [provider]  mirtazapine (REMERON) 15 MG tablet Take 1 tab at night 11/28/19   [provider]  valsartan (DIOVAN) 40 MG tablet Take by mouth. 12/06/19   [provider]  zolpidem (AMBIEN) 10 MG tablet Take by mouth. 02/22/20   [provider]    Allergies Flagyl [metronidazole] and Penicillins   REVIEW OF SYSTEMS  Negative except as noted here or in the History of Present Illness.   PHYSICAL EXAMINATION  Initial Vital Signs Blood pressure (!) 166/124, pulse (!) 101, temperature 98.2 F (36.8 C), temperature source Oral, resp. rate 20, height 5\' 2"  (1.575 m), weight 94.3 kg, SpO2 100 %.  Examination General: Well-developed, well-nourished female in no acute distress; appearance consistent with age of record HENT: normocephalic; atraumatic Eyes: Normal appearance Neck: supple Heart: regular rate and rhythm Lungs: clear to auscultation bilaterally Abdomen: soft; nondistended; nontender; bowel sounds present Extremities: No deformity; full range of motion; tenderness and mild swelling of left medial heel without ecchymosis, erythema or warmth Neurologic: Awake, alert and oriented; motor function intact in all extremities and symmetric; no  facial droop Skin: Warm and dry Psychiatric: Normal mood and affect   RESULTS  Summary of this visit's results, reviewed and interpreted by myself:   EKG Interpretation  Date/Time:    Ventricular Rate:    PR Interval:    QRS Duration:   QT Interval:    QTC Calculation:   R Axis:     Text Interpretation:        Laboratory Studies: Results for orders placed or performed during the hospital encounter of 11/13/20 (from the past 24 hour(s))  D-dimer, quantitative (not at [facility])     Status: None    Collection Time: 11/13/20  3:44 AM  Result Value Ref Range   D-Dimer, Quant 0.38 0.00 - 0.50 ug/mL-FEU   Imaging Studies: No results found.  ED COURSE and MDM  Nursing notes, initial and subsequent vitals signs, including pulse oximetry, reviewed and interpreted by myself.  Vitals:   11/13/20 0320 11/13/20 0322  BP: (!) 166/124   Pulse: (!) 101   Resp: 20   Temp: 98.2 F (36.8 C)   TempSrc: Oral   SpO2: 100%   Weight:  94.3 kg  Height:  5\' 2"  (1.575 m)   Medications  naproxen (NAPROSYN) tablet 500 mg (has no administration in time range)    The patient's D-dimer is not consistent with an acute deep vein thrombosis.  She may benefit from an NSAID and will refer to sports medicine if symptoms persist.  PROCEDURES  Procedures   ED DIAGNOSES     ICD-10-CM   1. Pain of left heel  M79.672        Rawson Minix, , MD 11/13/20 [phone]

## 2020-11-14 ENCOUNTER — Other Ambulatory Visit: Payer: Self-pay

## 2020-11-14 ENCOUNTER — Ambulatory Visit: Payer: Medicaid Other | Admitting: Family Medicine

## 2020-11-14 NOTE — Progress Notes (Deleted)
  Julie Myers - 50 y.o. female MRN [id number]  Date of birth: November 06, 1970  SUBJECTIVE:  Including CC & ROS.  No chief complaint on file.   Julie Myers is a 50 y.o. female that is  ***.  ***   Review of Systems See HPI   HISTORY: Past Medical, Surgical, Social, and Family History Reviewed & Updated per EMR.   Pertinent Historical Findings include:  Past Medical History:  Diagnosis Date  . Diabetes mellitus without complication (H[facility])   . Hypertension   . Miscarriage    X 3  . Other multiple births, all stillborn   . Stroke [facility])     Past Surgical History:  Procedure Laterality Date  . CESAREAN SECTION  05/07/2013  . CYST EXCISION    . DILATATION & CURETTAGE/HYSTEROSCOPY WITH TRUECLEAR     X 5  . TUBAL LIGATION      No family history on file.  Social History   Socioeconomic History  . Marital status: Married    Spouse name: Not on file  . Number of children: Not on file  . Years of education: Not on file  . Highest education level: Not on file  Occupational History  . Not on file  Tobacco Use  . Smoking status: Current Every Day Smoker    Packs/day: 0.50    Types: Cigarettes  . Smokeless tobacco: Never Used  Substance and Sexual Activity  . Alcohol use: No  . Drug use: No  . Sexual activity: Not Currently  Other Topics Concern  . Not on file  Social History Narrative  . Not on file   Social Determinants of Health   Financial Resource Strain: Not on file  Food Insecurity: Not on file  Transportation Needs: Not on file  Physical Activity: Not on file  Stress: Not on file  Social Connections: Not on file  Intimate Partner Violence: Not on file     PHYSICAL EXAM:  VS: There were no vitals taken for this visit. Physical Exam Gen: NAD, alert, cooperative with exam, well-appearing MSK:  ***      ASSESSMENT & PLAN:   No problem-specific Assessment & Plan notes found for this encounter.

## 2020-12-23 ENCOUNTER — Emergency Department (HOSPITAL_BASED_OUTPATIENT_CLINIC_OR_DEPARTMENT_OTHER)
Admission: EM | Admit: 2020-12-23 | Discharge: 2020-12-23 | Disposition: A | Discharge status: Home / Self Care | Payer: Medicaid Other | Attending: Emergency Medicine | Admitting: Emergency Medicine

## 2020-12-23 ENCOUNTER — Encounter (HOSPITAL_BASED_OUTPATIENT_CLINIC_OR_DEPARTMENT_OTHER): Payer: Self-pay

## 2020-12-23 ENCOUNTER — Emergency Department (HOSPITAL_BASED_OUTPATIENT_CLINIC_OR_DEPARTMENT_OTHER): Payer: Medicaid Other

## 2020-12-23 ENCOUNTER — Other Ambulatory Visit: Payer: Self-pay

## 2020-12-23 DIAGNOSIS — J069 Acute upper respiratory infection, unspecified: Secondary | ICD-10-CM | POA: Insufficient documentation

## 2020-12-23 DIAGNOSIS — Z8673 Personal history of transient ischemic attack (TIA), and cerebral infarction without residual deficits: Secondary | ICD-10-CM | POA: Diagnosis not present

## 2020-12-23 DIAGNOSIS — Z794 Long term (current) use of insulin: Secondary | ICD-10-CM | POA: Insufficient documentation

## 2020-12-23 DIAGNOSIS — Z79899 Other long term (current) drug therapy: Secondary | ICD-10-CM | POA: Diagnosis not present

## 2020-12-23 DIAGNOSIS — I10 Essential (primary) hypertension: Secondary | ICD-10-CM | POA: Diagnosis not present

## 2020-12-23 DIAGNOSIS — F17210 Nicotine dependence, cigarettes, uncomplicated: Secondary | ICD-10-CM | POA: Diagnosis not present

## 2020-12-23 DIAGNOSIS — E119 Type 2 diabetes mellitus without complications: Secondary | ICD-10-CM | POA: Insufficient documentation

## 2020-12-23 DIAGNOSIS — R0789 Other chest pain: Secondary | ICD-10-CM | POA: Diagnosis not present

## 2020-12-23 DIAGNOSIS — Z20822 Contact with and (suspected) exposure to covid-19: Secondary | ICD-10-CM | POA: Insufficient documentation

## 2020-12-23 DIAGNOSIS — R059 Cough, unspecified: Secondary | ICD-10-CM | POA: Diagnosis present

## 2020-12-23 MED ORDER — BENZONATATE 100 MG PO CAPS: 100.0000 mg | ORAL_CAPSULE | Freq: Three times a day (TID) | ORAL | 0 refills | Status: AC

## 2020-12-23 NOTE — ED Triage Notes (Signed)
Pt c/o flu like sx x 5 days-NAD

## 2020-12-23 NOTE — Discharge Instructions (Signed)
Your chest xray did not show any signs of pneumonia today Please await your COVID test results Continue taking Tylenol and Ibuprofen as needed for symptoms. Drink plenty of fluids to stay hydrated.  Take cough medication as needed for symptomatic relief  Follow up with your PCP regarding your ED visit  Return to the ED for any worsening symptoms

## 2020-12-23 NOTE — ED Provider Notes (Signed)
[facility] EMERGENCY DEPARTMENT Provider Note   CSN: [id number] Arrival date & time: 12/23/20  1535     History Chief Complaint  Patient presents with  . Cough    Julie Myers is a 50 y.o. female with PMHx HTN, stroke, and diabetes who presents to the ED today with complaint of URI symptoms that began 4 days ago. Pt complains of chills, fatigue, body aches, headache, nasal/chest congestion, shortness of breath at nighttime, cough, chest tightness from coughing. She denies fevers. She states she did not have a ride over the weekend however when her son became ill today she decided to bring him and herself to the ED for further evaluation. Son is here with a fever and a headache. She denies any recent sick contacts. She is vaccinated x 2 without booster. Pt has been taking OTC medications without relief. She denies chest pain, shortness of breath at rest, nausea, vomiting, diaphoresis, blurry vision, double vision, neck stiffness, rash, unilateral weakness or numbness, or any other associated symptoms.   The history is provided by the patient and medical records.       Past Medical History:  Diagnosis Date  . Diabetes mellitus without complication (HCC)   . Hypertension   . Miscarriage    X 3  . Other multiple births, all stillborn   . Stroke [facility])     There are no problems to display for this patient.   Past Surgical History:  Procedure Laterality Date  . CESAREAN SECTION  05/07/2013  . CYST EXCISION    . DILATATION & CURETTAGE/HYSTEROSCOPY WITH TRUECLEAR     X 5  . TUBAL LIGATION       OB History   No obstetric history on file.     No family history on file.  Social History   Tobacco Use  . Smoking status: Current Every Day Smoker    Packs/day: 0.50    Types: Cigarettes  . Smokeless tobacco: Never Used  Substance Use Topics  . Alcohol use: No  . Drug use: No    Home Medications Prior to Admission medications   Medication Sig Start Date End  Date Taking? Authorizing Provider  benzonatate (TESSALON) 100 MG capsule Take 1 capsule (100 mg total) by mouth every 8 (eight) hours. 12/23/20  Yes Venter, Margaux, PA-C  amLODipine (NORVASC) 10 MG tablet Take by mouth. 07/06/19   [provider]  atorvastatin (LIPITOR) 40 MG tablet Take by mouth. 12/18/19   [provider]  clonazePAM Scarlette Calico) 2 MG tablet Take 1/2 tab twice during the day and 1 at night 02/22/20   [provider]  insulin glargine (LANTUS SOLOSTAR) 100 UNIT/ML Solostar Pen INJECT 10 UNITS UNDER THE SKIN NIGHTLY. MAX DAILY DOSE IS 50 UNITS 02/19/20   [provider]  liraglutide (VICTOZA) 18 MG/3ML SOPN Inject into the skin. 10/23/19   [provider]  metoprolol succinate (TOPROL-XL) 25 MG 24 hr tablet Take by mouth. 12/18/19   [provider]  mirtazapine (REMERON) 15 MG tablet Take 1 tab at night 11/28/19   [provider]  valsartan (DIOVAN) 40 MG tablet Take by mouth. 12/06/19   [provider]  zolpidem (AMBIEN) 10 MG tablet Take by mouth. 02/22/20   [provider]    Allergies    Flagyl [metronidazole] and Penicillins  Review of Systems   Review of Systems  Constitutional: Positive for chills and fatigue.  Eyes: Negative for visual disturbance.  Respiratory: Positive for cough, chest tightness and  shortness of breath.   Cardiovascular: Negative for palpitations and leg swelling.  Gastrointestinal: Negative for abdominal pain, nausea and vomiting.  Musculoskeletal: Negative for neck pain and neck stiffness.  Skin: Negative for rash.  Neurological: Positive for headaches.  All other systems reviewed and are negative.   Physical Exam Updated Vital Signs BP (!) 184/109 (BP Location: Right Arm)   Pulse 88   Temp 98.1 F (36.7 C) (Oral)   Resp 20   Ht 5\' 2"  (1.575 m)   Wt 94.3 kg   LMP 12/18/2020   SpO2 98%   BMI 38.04 kg/m   Physical Exam Vitals and nursing note reviewed.   Constitutional:      Appearance: She is obese. She is not ill-appearing or diaphoretic.  HENT:     Head: Normocephalic and atraumatic.     Nose: Congestion present.  Eyes:     Conjunctiva/sclera: Conjunctivae normal.  Cardiovascular:     Rate and Rhythm: Normal rate and regular rhythm.     Pulses: Normal pulses.  Pulmonary:     Effort: Pulmonary effort is normal.     Breath sounds: Normal breath sounds. No wheezing, rhonchi or rales.     Comments: Speaking in full sentences without difficulty. Satting 98% on RA.  Musculoskeletal:     Cervical back: Normal range of motion.  Lymphadenopathy:     Cervical: No cervical adenopathy.  Skin:    General: Skin is warm and dry.     Coloration: Skin is not jaundiced.  Neurological:     Mental Status: She is alert.     ED Results / Procedures / Treatments   Labs (all labs ordered are listed, but only abnormal results are displayed) Labs Reviewed  SARS CORONAVIRUS 2 (TAT 6-24 HRS)    EKG None  Radiology DG Chest Port 1 View  Result Date: 12/23/2020 CLINICAL DATA:  Cough with flu like symptoms EXAM: PORTABLE CHEST 1 VIEW COMPARISON:  08/10/2013 FINDINGS: The heart size and mediastinal contours are within normal limits. Both lungs are clear. The visualized skeletal structures are unremarkable. IMPRESSION: No active disease. Electronically Signed   By: 08/12/2013 M.D.   On: 12/23/2020 18:42    Procedures Procedures   Medications Ordered in ED Medications - No data to display  ED Course  I have reviewed the triage vital signs and the nursing notes.  Pertinent labs & imaging results that were available during my care of the patient were reviewed by me and considered in my medical decision making (see chart for details).    MDM Rules/Calculators/A&P                          50 year old female who presents to the ED today with complaint of URI-like symptoms for the past 4 days, son is having fevers as well.  She is vaccinated  x2.  On arrival to the ED she is afebrile, nontachycardic nontachypneic and appears to be in no acute distress.  Blood pressure is elevated 184/109 however appears to be similar in the past.  Reports that her PCP is working with her to get her blood pressure under control.  She appears to be no acute distress at this time.  She is complaining of some chest tightness and shortness of breath mostly when laying flat at nighttime due to her chest congestion.  We will plan to obtain a chest x-ray to rule out pneumonia however we will plan to swab for Covid  at this time as well.  I am more suspicious for Covid or other viral illness giving her son is symptomatic as well.  She also reports that her husband is symptomatic however did not want to check into the ER today.  She is speaking in full sentences without difficulty and lungs are clear to auscultation bilaterally otherwise.  No acute findings on chest x-ray we will plan to discharge with instructions to await Covid testing.  CXR negative. Will discharge with tessalon perles and PCP follow up. Pt instructed to await covid results. She is in agreement with plan and stable for discharge.   This note was prepared using Dragon voice recognition software and may include unintentional dictation errors due to the inherent limitations of voice recognition software.  Brynnly Bonet was evaluated in Emergency Department on 12/23/2020 for the symptoms described in the history of present illness. She was evaluated in the context of the global COVID-19 pandemic, which necessitated consideration that the patient might be at risk for infection with the SARS-CoV-2 virus that causes COVID-19. Institutional protocols and algorithms that pertain to the evaluation of patients at risk for COVID-19 are in a state of rapid change based on information released by regulatory bodies including the CDC and federal and state organizations. These policies and algorithms were followed during  the patient's care in the ED.  Final Clinical Impression(s) / ED Diagnoses Final diagnoses:  Viral URI with cough    Rx / DC Orders ED Discharge Orders         Ordered    benzonatate (TESSALON) 100 MG capsule  Every 8 hours        12/23/20 1910           Discharge Instructions     Your chest xray did not show any signs of pneumonia today Please await your COVID test results Continue taking Tylenol and Ibuprofen as needed for symptoms. Drink plenty of fluids to stay hydrated.  Take cough medication as needed for symptomatic relief  Follow up with your PCP regarding your ED visit  Return to the ED for any worsening symptoms       Tanda Rockers, PA-C 12/23/20 1911    Pollyann Savoy, MD 12/23/20 2257

## 2020-12-24 LAB — SARS CORONAVIRUS 2 (TAT 6-24 HRS): SARS Coronavirus 2: NEGATIVE

## 2021-03-15 ENCOUNTER — Other Ambulatory Visit: Payer: Self-pay

## 2021-03-15 ENCOUNTER — Encounter (HOSPITAL_BASED_OUTPATIENT_CLINIC_OR_DEPARTMENT_OTHER): Payer: Self-pay

## 2021-03-15 ENCOUNTER — Emergency Department (HOSPITAL_BASED_OUTPATIENT_CLINIC_OR_DEPARTMENT_OTHER)
Admission: EM | Admit: 2021-03-15 | Discharge: 2021-03-15 | Disposition: A | Discharge status: Home / Self Care | Payer: No Typology Code available for payment source | Attending: Emergency Medicine | Admitting: Emergency Medicine

## 2021-03-15 ENCOUNTER — Emergency Department (HOSPITAL_BASED_OUTPATIENT_CLINIC_OR_DEPARTMENT_OTHER): Payer: No Typology Code available for payment source

## 2021-03-15 DIAGNOSIS — E119 Type 2 diabetes mellitus without complications: Secondary | ICD-10-CM | POA: Insufficient documentation

## 2021-03-15 DIAGNOSIS — M542 Cervicalgia: Secondary | ICD-10-CM | POA: Insufficient documentation

## 2021-03-15 DIAGNOSIS — Z79899 Other long term (current) drug therapy: Secondary | ICD-10-CM | POA: Diagnosis not present

## 2021-03-15 DIAGNOSIS — I10 Essential (primary) hypertension: Secondary | ICD-10-CM | POA: Diagnosis not present

## 2021-03-15 DIAGNOSIS — Z794 Long term (current) use of insulin: Secondary | ICD-10-CM | POA: Diagnosis not present

## 2021-03-15 DIAGNOSIS — R519 Headache, unspecified: Secondary | ICD-10-CM | POA: Insufficient documentation

## 2021-03-15 DIAGNOSIS — M25512 Pain in left shoulder: Secondary | ICD-10-CM | POA: Insufficient documentation

## 2021-03-15 DIAGNOSIS — Y92410 Unspecified street and highway as the place of occurrence of the external cause: Secondary | ICD-10-CM | POA: Insufficient documentation

## 2021-03-15 DIAGNOSIS — F17210 Nicotine dependence, cigarettes, uncomplicated: Secondary | ICD-10-CM | POA: Diagnosis not present

## 2021-03-15 DIAGNOSIS — R0789 Other chest pain: Secondary | ICD-10-CM | POA: Insufficient documentation

## 2021-03-15 MED ORDER — IBUPROFEN 400 MG PO TABS
600.0000 mg | ORAL_TABLET | Freq: Once | ORAL | Status: AC
  Administered 2021-03-15: 600 mg via ORAL
  Filled 2021-03-15: qty 1

## 2021-03-15 MED ORDER — OXYCODONE-ACETAMINOPHEN 5-325 MG PO TABS: 1.0000 | ORAL_TABLET | Freq: Four times a day (QID) | ORAL | 0 refills | Status: AC | PRN

## 2021-03-15 MED ORDER — ACETAMINOPHEN 325 MG PO TABS
650.0000 mg | ORAL_TABLET | Freq: Once | ORAL | Status: AC
  Administered 2021-03-15: 650 mg via ORAL
  Filled 2021-03-15: qty 2
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
## 2021-03-15 NOTE — Discharge Instructions (Addendum)
Call your primary care doctor or specialist as discussed in the next 2-3 days.   Return immediately back to the ER if:  Your symptoms worsen within the next 12-24 hours. You develop new symptoms such as new fevers, persistent vomiting, new pain, shortness of breath, or new weakness or numbness, or if you have any other concerns.  
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
## 2023-01-25 ENCOUNTER — Encounter: Payer: Self-pay | Admitting: *Deleted

## 2023-07-23 ENCOUNTER — Other Ambulatory Visit: Payer: Self-pay

## 2023-07-23 ENCOUNTER — Emergency Department (HOSPITAL_BASED_OUTPATIENT_CLINIC_OR_DEPARTMENT_OTHER)
Admission: EM | Admit: 2023-07-23 | Discharge: 2023-07-23 | Discharge status: Against Medical Advice | Payer: Medicaid Other | Attending: Emergency Medicine | Admitting: Emergency Medicine

## 2023-07-23 ENCOUNTER — Encounter (HOSPITAL_BASED_OUTPATIENT_CLINIC_OR_DEPARTMENT_OTHER): Payer: Self-pay | Admitting: Pediatrics

## 2023-07-23 ENCOUNTER — Emergency Department (HOSPITAL_BASED_OUTPATIENT_CLINIC_OR_DEPARTMENT_OTHER): Payer: Medicaid Other

## 2023-07-23 DIAGNOSIS — Z79899 Other long term (current) drug therapy: Secondary | ICD-10-CM | POA: Diagnosis not present

## 2023-07-23 DIAGNOSIS — J069 Acute upper respiratory infection, unspecified: Secondary | ICD-10-CM

## 2023-07-23 DIAGNOSIS — E1165 Type 2 diabetes mellitus with hyperglycemia: Secondary | ICD-10-CM | POA: Insufficient documentation

## 2023-07-23 DIAGNOSIS — R079 Chest pain, unspecified: Secondary | ICD-10-CM

## 2023-07-23 DIAGNOSIS — R739 Hyperglycemia, unspecified: Secondary | ICD-10-CM

## 2023-07-23 LAB — BASIC METABOLIC PANEL
Anion gap: 15 (ref 5–15)
BUN: 17 mg/dL (ref 6–20)
CO2: 19 mmol/L — ABNORMAL LOW (ref 22–32)
Calcium: 9.1 mg/dL (ref 8.9–10.3)
Chloride: 98 mmol/L (ref 98–111)
Creatinine, Ser: 1.29 mg/dL — ABNORMAL HIGH (ref 0.44–1.00)
GFR, Estimated: 50 mL/min — ABNORMAL LOW (ref >60)
Glucose, Bld: 559 mg/dL (ref 70–99)
Potassium: 3.7 mmol/L (ref 3.5–5.1)
Sodium: 132 mmol/L — ABNORMAL LOW (ref 135–145)

## 2023-07-23 LAB — TROPONIN I (HIGH SENSITIVITY)
Troponin I (High Sensitivity): 24 ng/L — ABNORMAL HIGH (ref <18)
Troponin I (High Sensitivity): 20 ng/L — ABNORMAL HIGH (ref <18)

## 2023-07-23 LAB — URINALYSIS, ROUTINE W REFLEX MICROSCOPIC
Bilirubin Urine: NEGATIVE
Glucose, UA: >=500 mg/dL — AB
Hgb urine dipstick: NEGATIVE
Ketones, ur: NEGATIVE mg/dL
Leukocytes,Ua: NEGATIVE
Nitrite: NEGATIVE
Protein, ur: NEGATIVE mg/dL
Specific Gravity, Urine: 1.015 (ref 1.005–1.030)
pH: 5 (ref 5.0–8.0)

## 2023-07-23 LAB — CBC
HCT: 42.8 % (ref 36.0–46.0)
Hemoglobin: 14.3 g/dL (ref 12.0–15.0)
MCH: 29.3 pg (ref 26.0–34.0)
MCHC: 33.4 g/dL (ref 30.0–36.0)
MCV: 87.7 fL (ref 80.0–100.0)
Platelets: 318 10*3/uL (ref 150–400)
RBC: 4.88 MIL/uL (ref 3.87–5.11)
RDW: 12.6 % (ref 11.5–15.5)
WBC: 13.4 10*3/uL — ABNORMAL HIGH (ref 4.0–10.5)
nRBC: 0 % (ref 0.0–0.2)

## 2023-07-23 LAB — PREGNANCY, URINE: Preg Test, Ur: NEGATIVE

## 2023-07-23 LAB — URINALYSIS, MICROSCOPIC (REFLEX)
Bacteria, UA: NONE SEEN
RBC / HPF: NONE SEEN RBC/hpf (ref 0–5)

## 2023-07-23 MED ORDER — ASPIRIN 81 MG PO CHEW
243.0000 mg | CHEWABLE_TABLET | ORAL | Status: AC
  Administered 2023-07-23: 243 mg via ORAL
  Filled 2023-07-23: qty 3

## 2023-07-23 MED ORDER — NITROGLYCERIN 0.4 MG SL SUBL: 0.4000 mg | SUBLINGUAL_TABLET | SUBLINGUAL | Status: DC | PRN

## 2023-07-23 NOTE — ED Provider Notes (Signed)
[place] EMERGENCY DEPARTMENT AT [facility] Provider Note   CSN: [id number] Arrival date & time: 07/23/23  1439     History  Chief Complaint  Patient presents with   Chest Pain    Julie Myers is a 52 y.o. female.  52 year old female with a history of NSTEMI status post PCI in 2022, HTN, DM, and HLD who presents emergency department chest discomfort.  Says that for the past 3 weeks has been dealing with a URI.  Has been having congestion and a non-productive cough.  Last night started having some chest tightness.  Describes it as substernal.  Thinks that it is exertional.  Initially was 7/10 in severity.  Tried some nitroglycerin and aspirin for it but did not significantly change her symptoms.  No diaphoresis or vomiting.  No radiation.  Also has some mild shortness of breath that has been longstanding specially when she walks around.  No fevers recently.  Saw her primary doctor yesterday and had a chest x-ray that showed some congestion as well as a COVID test that was negative.  Was prescribed doxycycline and an inhaled corticosteroid that she has been taking.       Home Medications Prior to Admission medications   Medication Sig Start Date End Date Taking? Authorizing Provider  amLODipine (NORVASC) 10 MG tablet Take by mouth. 07/06/19   [provider]  atorvastatin (LIPITOR) 40 MG tablet Take by mouth. 12/18/19   [provider]  benzonatate (TESSALON) 100 MG capsule Take 1 capsule (100 mg total) by mouth every 8 (eight) hours. 12/23/20   Tanda Rockers, PA-C  clonazePAM (KLONOPIN) 2 MG tablet Take 1/2 tab twice during the day and 1 at night 02/22/20   [provider]  insulin glargine (LANTUS SOLOSTAR) 100 UNIT/ML Solostar Pen INJECT 10 UNITS UNDER THE SKIN NIGHTLY. MAX DAILY DOSE IS 50 UNITS 02/19/20   [provider]  liraglutide (VICTOZA) 18 MG/3ML SOPN Inject into the skin. 10/23/19   [provider]  metoprolol  succinate (TOPROL-XL) 25 MG 24 hr tablet Take by mouth. 12/18/19   [provider]  mirtazapine (REMERON) 15 MG tablet Take 1 tab at night 11/28/19   [provider]  oxyCODONE-acetaminophen (PERCOCET/ROXICET) 5-325 MG tablet Take 1 tablet by mouth every 6 (six) hours as needed for up to 5 doses for severe pain. 03/15/21   Cheryll Cockayne, MD  valsartan (DIOVAN) 40 MG tablet Take by mouth. 12/06/19   [provider]  zolpidem (AMBIEN) 10 MG tablet Take by mouth. 02/22/20   [provider]      Allergies    Flagyl [metronidazole] and Penicillins    Review of Systems   Review of Systems  Physical Exam Updated Vital Signs BP (!) 141/80   Pulse 85   Temp 97.9 F (36.6 C) (Oral)   Resp 20   Ht 5\' 2"  (1.575 m)   Wt 94.8 kg   SpO2 99%   BMI 38.23 kg/m  Physical Exam Vitals and nursing note reviewed.  Constitutional:      General: She is not in acute distress.    Appearance: She is well-developed.  HENT:     Head: Normocephalic and atraumatic.     Right Ear: External ear normal.     Left Ear: External ear normal.     Nose: Congestion present.  Eyes:     Extraocular Movements: Extraocular movements intact.     Conjunctiva/sclera: Conjunctivae normal.     Pupils: Pupils are  equal, round, and reactive to light.  Cardiovascular:     Rate and Rhythm: Normal rate and regular rhythm.     Heart sounds: No murmur heard.    Comments: Chest pain reproducible Pulmonary:     Effort: Pulmonary effort is normal. No respiratory distress.     Breath sounds: Normal breath sounds.  Musculoskeletal:     Cervical back: Normal range of motion and neck supple.     Right lower leg: No edema.     Left lower leg: No edema.  Skin:    General: Skin is warm and dry.  Neurological:     Mental Status: She is alert and oriented to person, place, and time. Mental status is at baseline.  Psychiatric:        Mood and Affect: Mood normal.     ED Results / Procedures /  Treatments   Labs (all labs ordered are listed, but only abnormal results are displayed) Labs Reviewed  BASIC METABOLIC PANEL - Abnormal; Notable for the following components:      Result Value   Sodium 132 (*)    CO2 19 (*)    Glucose, Bld 559 (*)    Creatinine, Ser 1.29 (*)    GFR, Estimated 50 (*)    All other components within normal limits  CBC - Abnormal; Notable for the following components:   WBC 13.4 (*)    All other components within normal limits  URINALYSIS, ROUTINE W REFLEX MICROSCOPIC - Abnormal; Notable for the following components:   Glucose, UA >=500 (*)    All other components within normal limits  TROPONIN I (HIGH SENSITIVITY) - Abnormal; Notable for the following components:   Troponin I (High Sensitivity) 24 (*)    All other components within normal limits  TROPONIN I (HIGH SENSITIVITY) - Abnormal; Notable for the following components:   Troponin I (High Sensitivity) 20 (*)    All other components within normal limits  PREGNANCY, URINE  URINALYSIS, MICROSCOPIC (REFLEX)  CBG MONITORING, ED    EKG EKG Interpretation Date/Time:  Friday July 23 2023 14:52:04 EDT Ventricular Rate:  89 PR Interval:  158 QRS Duration:  91 QT Interval:  368 QTC Calculation: 448 R Axis:   -36  Text Interpretation: Sinus rhythm Probable left atrial enlargement Abnormal R-wave progression, late transition Left ventricular hypertrophy No significant change since last tracing Confirmed by Vonita Moss [phone]) on 07/23/2023 3:03:15 PM  Radiology DG Chest Portable 1 View  Result Date: 07/23/2023 CLINICAL DATA:  Chest pain EXAM: PORTABLE CHEST 1 VIEW COMPARISON:  X-ray 12/23/2020 FINDINGS: No consolidation, pneumothorax or effusion. No edema. Normal cardiopericardial silhouette. Overlapping cardiac leads. IMPRESSION: No acute cardiopulmonary disease. Electronically Signed   By: Karen Kays M.D.   On: 07/23/2023 16:37    Procedures Procedures    Medications Ordered in  ED Medications  aspirin chewable tablet 243 mg (243 mg Oral Given 07/23/23 1545)    ED Course/ Medical Decision Making/ A&P                                 Medical Decision Making Amount and/or Complexity of Data Reviewed Labs: ordered. Radiology: ordered.  Risk OTC drugs. Prescription drug management.   Julie Myers is a 52 y.o. female with comorbidities that complicate the patient evaluation including NSTEMI status post PCI in 2022, HTN, DM, and HLD who presents emergency department chest discomfort.   Initial Ddx:  URI, bronchitis,  COPD/asthma, CHF, MI, PE  MDM/Course:  Patient presents to the emergency department with URI symptoms and chest tightness.  On exam is overall well-appearing.  No wheezing or rails.  Does not appear grossly volume overloaded.  Initially was concerned about possible MI given the patient's cardiac history.  EKG without acute ischemic changes.  Her initial troponin was elevated at 26 which upon repeat down trended to 20.  Aspirin and nitroglycerin did not make her chest pain any better.  Do suspect that her tightness is due to her URI that she is having.  Upon re-evaluation this overall well-appearing.  Her blood sugar returned at severely elevated at 559.  Reports that she has not been checking it recently.  Says that she does still have her diabetic medications and she will take these but is requesting to leave prior to receiving any insulin and prior to her urinalysis coming back which we need to check for DKA since she does have a mild acidosis. The patient has decided to leave against medical advice because she wanted to pick up her children. They have adequate capacity to make medical decisions at this time. The risks have been explained to the patient, including worsening illness, chronic pain, permanent disability and death. The patient was able to understand and state the risks and benefits of hospital admission and they had the opportunity to ask  questions. The patient was treated to the extent that they would allow and knows that they may return for care at any time. Will dc with instructions to follow-up with her cardiologist and primary doctor.   Urinalysis later returned without any ketones.  Feel that DKA highly unlikely for this patient.   This patient presents to the ED for concern of complaints listed in HPI, this involves an extensive number of treatment options, and is a complaint that carries with it a high risk of complications and morbidity. Disposition including potential need for admission considered.   Dispo: DC Home. Return precautions discussed including, but not limited to, those listed in the AVS. Allowed pt time to ask questions which were answered fully prior to dc.  Records reviewed Outpatient Clinic Notes The following labs were independently interpreted: Chemistry and show  hyperglycemia I independently reviewed the following imaging with scope of interpretation limited to determining acute life threatening conditions related to emergency care: Chest x-ray and agree with the radiologist interpretation with the following exceptions: none I personally reviewed and interpreted cardiac monitoring: normal sinus rhythm  I personally reviewed and interpreted the pt's EKG: see above for interpretation  I have reviewed the patients home medications and made adjustments as needed  Portions of this note were generated with Dragon dictation software. Dictation errors may occur despite best attempts at proofreading.           Final Clinical Impression(s) / ED Diagnoses Final diagnoses:  Chest pain, unspecified type  Upper respiratory tract infection, unspecified type  Hyperglycemia    Rx / DC Orders ED Discharge Orders     None         Rondel Baton, MD 07/23/23 2312

## 2023-07-23 NOTE — ED Triage Notes (Signed)
C/O chest pain started last night when she attempted to get up to use the restroom. Patient reported she has a stent x1; and was told she could potentially need more.

## 2023-07-23 NOTE — ED Notes (Signed)
Pt elected to leave AMA   Dr. Eloise Harman at bedside and explained risks of leaving without completed work up and treatment.  Pt advised to follow up with her physician asap and continue taking her medications as directed.  Come back for any problems.  Pt declined discharge vital signs at this time.  Pt stated she had to leave because she could not find anyone to pick up her son.

## 2024-10-27 ENCOUNTER — Other Ambulatory Visit (HOSPITAL_COMMUNITY): Payer: Self-pay
